# Patient Record
Sex: Male | Born: 1965 | Race: White | Hispanic: No | Marital: Married | Smoking: Current every day smoker
Health system: Southern US, Academic
[De-identification: ages and names within clinical notes are randomized; demographics above are authoritative.]

## PROBLEM LIST (undated history)

## (undated) DIAGNOSIS — J449 Chronic obstructive pulmonary disease, unspecified: Secondary | ICD-10-CM

## (undated) DIAGNOSIS — I10 Essential (primary) hypertension: Secondary | ICD-10-CM

## (undated) DIAGNOSIS — E782 Mixed hyperlipidemia: Secondary | ICD-10-CM

## (undated) DIAGNOSIS — E119 Type 2 diabetes mellitus without complications: Secondary | ICD-10-CM

## (undated) HISTORY — PX: HAND SURGERY: SHX662

---

## 2000-02-16 ENCOUNTER — Other Ambulatory Visit (HOSPITAL_COMMUNITY): Payer: Self-pay

## 2022-01-21 IMAGING — MR MRI LUMBAR SPINE WITHOUT CONTRAST
6 series · 48 of 48 positions shown · IV contrast (gadolinium)
Comparison: None available.

﻿EXAM:  [id number]   MRI LUMBAR SPINE WITHOUT CONTRAST
INDICATION: Lower back pain with right lower extremity radiculopathy.
TECHNIQUE: Multiplanar multisequential MRI of the lumbar spine was performed without gadolinium contrast.

[Series 7: T2 · coronal · 4.0mm · 1.79mm/px · 9 of 20 slices shown (1 of 3)]
[im 1/20]
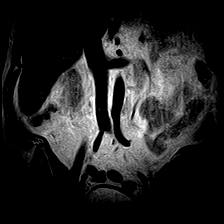
[im 3/20]
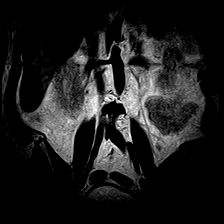
[im 5/20]
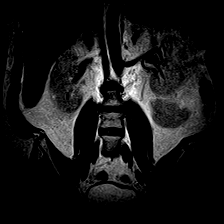
[im 8/20]
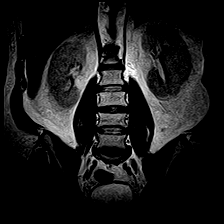
[im 10/20]
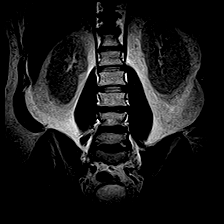
[im 12/20]
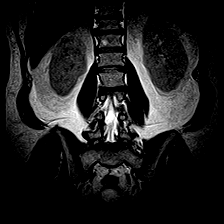
[im 15/20]
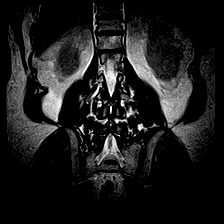
[im 17/20]
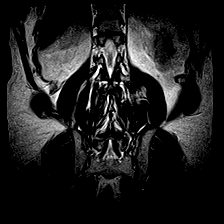
[im 20/20]
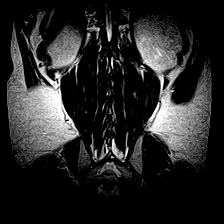

[Series 8: T2 · sagittal · 5.0mm · 1.00mm/px · 7 of 15 slices shown (2 of 3)]
[im 1/15]
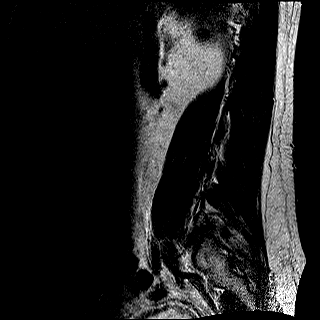
[im 3/15]
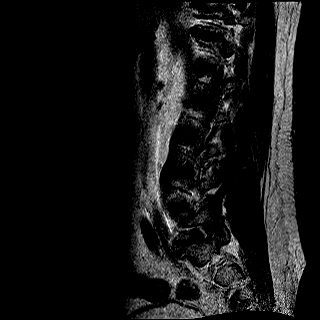
[im 5/15]
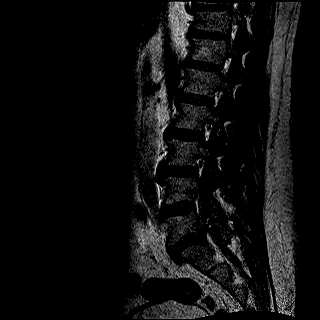
[im 8/15]
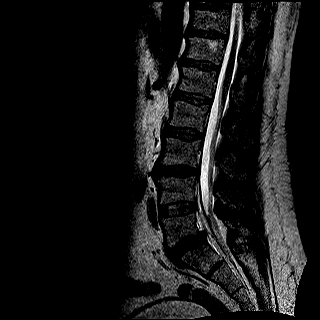
[im 10/15]
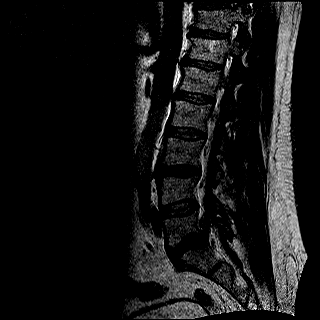
[im 12/15]
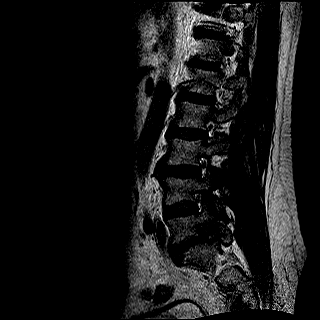
[im 15/15]
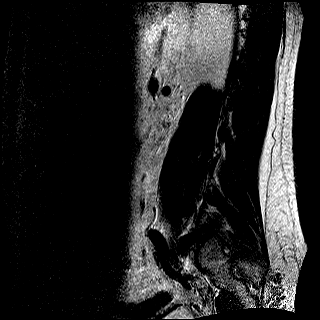

[Series 9: T1 · sagittal · 5.0mm · 1.00mm/px · 6 of 15 slices shown (1 of 2)]
[im 1/15]
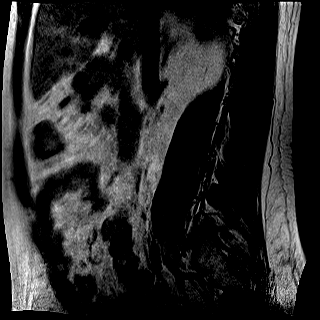
[im 3/15]
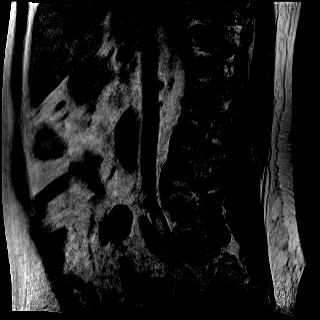
[im 6/15]
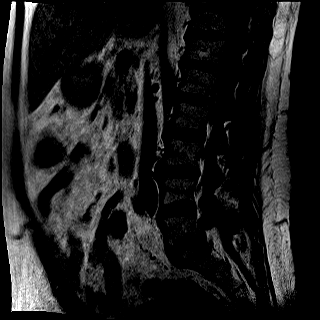
[im 9/15]
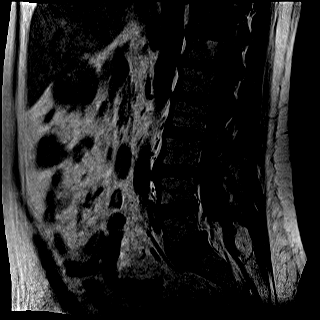
[im 12/15]
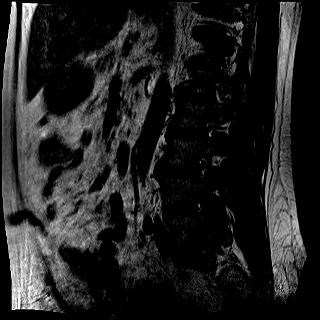
[im 15/15]
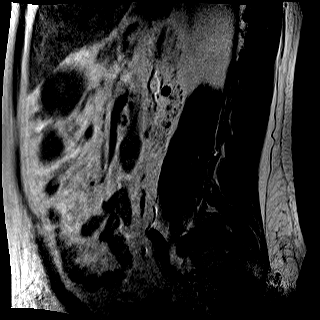

[Series 10: STIR · sagittal · 5.0mm · 1.25mm/px · 6 of 15 slices shown]
[im 1/15]
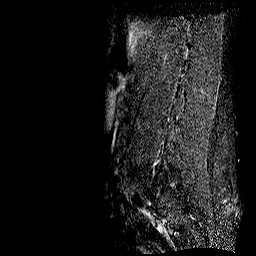
[im 3/15]
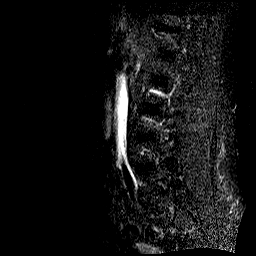
[im 6/15]
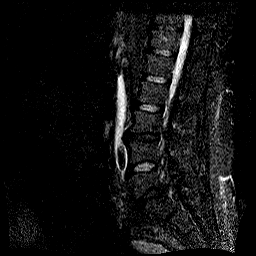
[im 9/15]
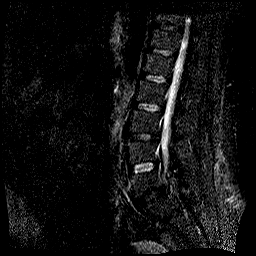
[im 12/15]
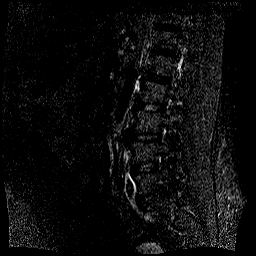
[im 15/15]
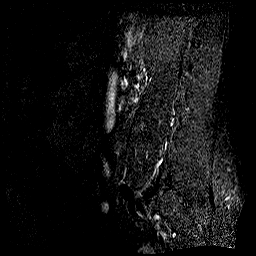

[Series 11: T2 · axial · 5.0mm · 0.89mm/px · z∈[-135,+69]mm · 10 of 25 slices shown (3 of 3)]
[im 1/25]
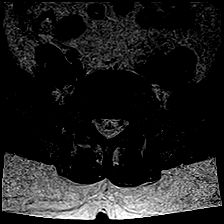
[im 3/25]
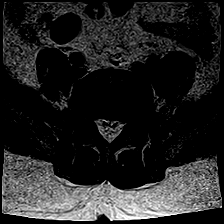
[im 6/25]
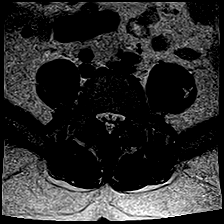
[im 9/25]
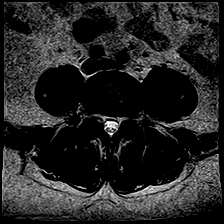
[im 11/25]
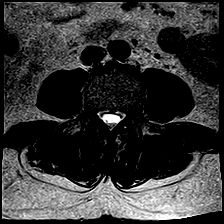
[im 14/25]
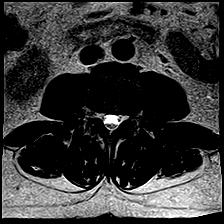
[im 17/25]
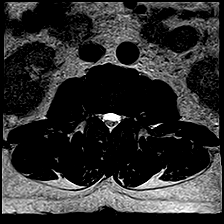
[im 19/25]
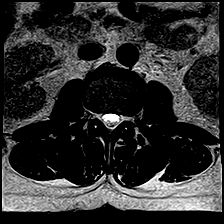
[im 22/25]
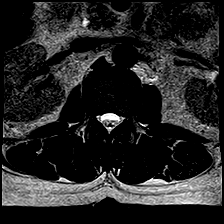
[im 25/25]
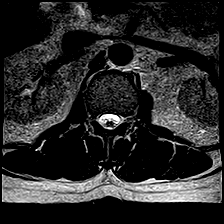

[Series 12: T1 · axial · 5.0mm · 0.89mm/px · z∈[-135,+69]mm · 10 of 25 slices shown (2 of 2)]
[im 1/25]
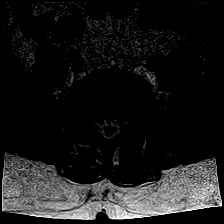
[im 3/25]
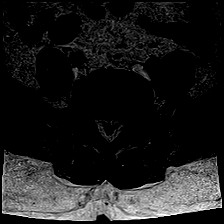
[im 6/25]
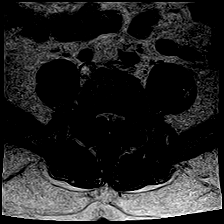
[im 9/25]
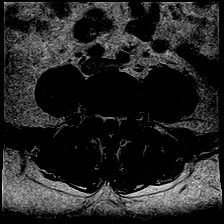
[im 11/25]
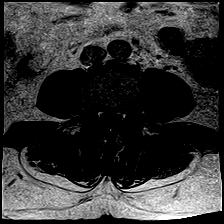
[im 14/25]
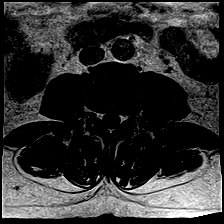
[im 17/25]
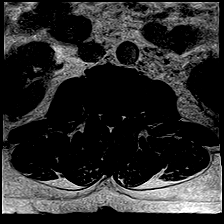
[im 19/25]
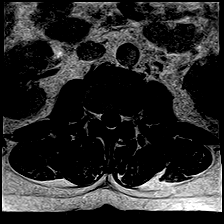
[im 22/25]
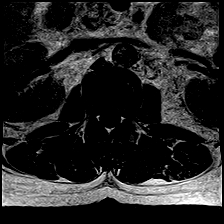
[im 25/25]
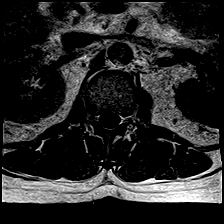

[48 of 48 positions shown; findings below may reference images not displayed]

FINDINGS: Vertebral bodies are normal in height, alignment and signal intensity. There is no acute fracture or subluxation. Distal spinal cord is normal in signal intensity and terminates normally at L1-2 disc space level. Spinal canal is congenitally narrow. 

L1-2 and L2-3 levels are unremarkable.

At L3-4 level, there is a small broad-based central disc bulge mildly effacing the ventral thecal sac.  There is mild-to-moderate bilateral neural foraminal stenosis from bulging annulus. 

At L4-5 level, there is moderate bilateral neural foraminal stenosis from facet arthropathy. 

At L5-S1 level, there is a small broad-based central disc bulge without mass effect on the thecal sac.  There is moderate to severe spinal stenosis from epidural lipomatosis. There is severe bilateral neural foraminal stenosis from facet arthropathy and bulging annulus. 

Paraspinal soft tissues are unremarkable
IMPRESSION: 1. No significant disc herniation at any level.  

2. Moderate to severe spinal stenosis at L5-S1 level from epidural lipomatosis. 

3. Multilevel neural foraminal stenosis as detailed above.

## 2022-01-27 ENCOUNTER — Other Ambulatory Visit: Payer: Self-pay

## 2022-01-27 ENCOUNTER — Emergency Department
Admission: EM | Admit: 2022-01-27 | Discharge: 2022-01-27 | Disposition: A | Discharge status: Home / Self Care | Payer: HMO | Attending: Family | Admitting: Family

## 2022-01-27 ENCOUNTER — Encounter (HOSPITAL_COMMUNITY): Payer: Self-pay

## 2022-01-27 ENCOUNTER — Emergency Department (HOSPITAL_COMMUNITY): Payer: HMO

## 2022-01-27 DIAGNOSIS — J449 Chronic obstructive pulmonary disease, unspecified: Secondary | ICD-10-CM | POA: Insufficient documentation

## 2022-01-27 DIAGNOSIS — E119 Type 2 diabetes mellitus without complications: Secondary | ICD-10-CM | POA: Insufficient documentation

## 2022-01-27 DIAGNOSIS — I10 Essential (primary) hypertension: Secondary | ICD-10-CM

## 2022-01-27 DIAGNOSIS — I440 Atrioventricular block, first degree: Secondary | ICD-10-CM | POA: Insufficient documentation

## 2022-01-27 DIAGNOSIS — R079 Chest pain, unspecified: Secondary | ICD-10-CM | POA: Insufficient documentation

## 2022-01-27 HISTORY — DX: Mixed hyperlipidemia: E78.2

## 2022-01-27 HISTORY — DX: Type 2 diabetes mellitus without complications (CMS HCC): E11.9

## 2022-01-27 HISTORY — DX: Chronic obstructive pulmonary disease, unspecified (CMS HCC): J44.9

## 2022-01-27 HISTORY — DX: Essential (primary) hypertension: I10

## 2022-01-27 LAB — PT/INR
INR: 1.01 (ref <=5.00)
PROTHROMBIN TIME: 11.7 seconds (ref 9.8–12.7)

## 2022-01-27 LAB — COMPREHENSIVE METABOLIC PANEL, NON-FASTING
ALBUMIN/GLOBULIN RATIO: 2.1 — ABNORMAL HIGH (ref 0.8–1.4)
ALBUMIN: 4.5 g/dL (ref 3.5–5.7)
ALKALINE PHOSPHATASE: 64 U/L (ref 34–104)
ALT (SGPT): 21 U/L (ref 7–52)
ANION GAP: 9 mmol/L — ABNORMAL LOW (ref 10–20)
AST (SGOT): 14 U/L (ref 13–39)
BILIRUBIN TOTAL: 0.3 mg/dL (ref 0.3–1.2)
BUN/CREA RATIO: 24 — ABNORMAL HIGH (ref 6–22)
BUN: 17 mg/dL (ref 7–25)
CALCIUM, CORRECTED: 9 mg/dL (ref 8.9–10.8)
CALCIUM: 9.5 mg/dL (ref 8.6–10.3)
CHLORIDE: 105 mmol/L (ref 98–107)
CO2 TOTAL: 23 mmol/L (ref 21–31)
CREATININE: 0.71 mg/dL (ref 0.60–1.30)
ESTIMATED GFR: 108 mL/min/{1.73_m2} (ref >59)
GLOBULIN: 2.1 — ABNORMAL LOW (ref 2.9–5.4)
GLUCOSE: 216 mg/dL — ABNORMAL HIGH (ref 74–109)
OSMOLALITY, CALCULATED: 282 mOsm/kg (ref 270–290)
POTASSIUM: 4.1 mmol/L (ref 3.5–5.1)
PROTEIN TOTAL: 6.6 g/dL (ref 6.4–8.9)
SODIUM: 137 mmol/L (ref 136–145)

## 2022-01-27 LAB — CBC WITH DIFF
BASOPHIL #: 0.1 10*3/uL (ref 0.00–0.30)
BASOPHIL %: 1 % (ref 0–3)
EOSINOPHIL #: 0.1 10*3/uL (ref 0.00–0.80)
EOSINOPHIL %: 1 % (ref 0–7)
HCT: 47.2 % (ref 42.0–51.0)
HGB: 16.3 g/dL (ref 13.5–18.0)
LYMPHOCYTE #: 2.6 10*3/uL (ref 1.10–5.00)
LYMPHOCYTE %: 24 % — ABNORMAL LOW (ref 25–45)
MCH: 32.2 pg — ABNORMAL HIGH (ref 27.0–32.0)
MCHC: 34.6 g/dL (ref 32.0–36.0)
MCV: 93.1 fL (ref 78.0–99.0)
MONOCYTE #: 1 10*3/uL (ref 0.00–1.30)
MONOCYTE %: 9 % (ref 0–12)
MPV: 7.2 fL — ABNORMAL LOW (ref 7.4–10.4)
NEUTROPHIL #: 7.1 10*3/uL (ref 1.80–8.40)
NEUTROPHIL %: 66 % (ref 40–76)
PLATELETS: 361 10*3/uL (ref 140–440)
RBC: 5.07 10*6/uL (ref 4.20–6.00)
RDW: 13.5 % (ref 11.6–14.8)
WBC: 10.8 10*3/uL — ABNORMAL HIGH (ref 4.0–10.5)
WBCS UNCORRECTED: 10.8 10*3/uL

## 2022-01-27 LAB — TROPONIN-I
TROPONIN I: 7 ng/L (ref <20)
TROPONIN I: 8 ng/L (ref <20)

## 2022-01-27 LAB — MAGNESIUM: MAGNESIUM: 1.9 mg/dL (ref 1.9–2.7)

## 2022-01-27 LAB — B-TYPE NATRIURETIC PEPTIDE: BNP: <25 pg/mL (ref 5–100)

## 2022-01-27 LAB — PTT (PARTIAL THROMBOPLASTIN TIME): APTT: 31.2 seconds (ref 26.0–36.0)

## 2022-01-27 NOTE — ED Nurses Note (Signed)
Patient discharged to home. A written copy of discharge instructions was given to the patient. Questions sufficiently answered as needed. Patient encouraged to follow up with PCP as indicated. IV removed, catheter intact. Pressure dressing applied and bleeding controlled. Patient left department via ambulation.

## 2022-01-27 NOTE — ED Nurses Note (Addendum)
Patient to EDM via EMS with complaints of chest pain that started around 1330 today. (around left breast). Patient denies any SOB. Patient states current pain level is 7/10. Patient placed on bedside monitoring. Assessment as documented. Call bell in reach.

## 2022-01-27 NOTE — ED Provider Notes (Signed)
[facility]  ED Primary Provider Note  History of Present Illness   Chief Complaint   Patient presents with   . Chest Pain      Shane Hartman is a 56 y.o. male who had concerns including Chest Pain .  Arrival: The patient arrived by Ambulance    Patient 56 year old male presents emergency department complaining of substernal chest pain that started approximately 1 hour prior to arrival.  Patient states it is directly in the middle of his chest and denies radiation.  Patient describes as a sharp pain that is constant and he rates it a 5/10. 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 Patient was brought in by EMS and given nitroglycerin x2, 324 of aspirin, and 4 mg of morphine with no relief of pain.  Patient denies shortness of breath, nausea, vomiting, abdominal pain.  Patient states he does have a history of hypertension, diabetes, COPD, and a family history of MIs.  Patient personally denies any heart attacks or stents in the past.          Review of Systems   Pertinent positive and negative ROS as per HPI.  Historical Data   History Reviewed This Encounter:      Physical Exam   ED Triage Vitals [01/27/22 1750]   BP (Non-Invasive) 123/81   Heart Rate 95   Respiratory Rate 16   Temperature 36.3 C (97.4 F)   SpO2 93 %   Weight 127 kg (280 lb)   Height 1.778 m ('5\' 10"' )     Physical Exam  Vitals and nursing note reviewed.   Constitutional:       General: He is not in acute distress.     Appearance: He is well-developed.   HENT:      Head: Normocephalic and atraumatic.   Eyes:      Conjunctiva/sclera: Conjunctivae normal.   Cardiovascular:      Rate and Rhythm: Normal rate and regular rhythm.      Heart sounds: S1 normal and S2 normal. No murmur heard.  Pulmonary:      Effort: Pulmonary effort is normal. No respiratory distress.      Breath sounds: Normal breath sounds.   Abdominal:      Palpations: Abdomen is soft.      Tenderness:  There is no abdominal tenderness.   Musculoskeletal:         General: No swelling.      Cervical back: Neck supple.   Skin:     General: Skin is warm and dry.      Capillary Refill: Capillary refill takes less than 2 seconds.   Neurological:      Mental Status: He is alert.   Psychiatric:         Mood and Affect: Mood normal.       Patient Data     Labs Ordered/Reviewed   COMPREHENSIVE METABOLIC PANEL, NON-FASTING - Abnormal; Notable for the following components:       Result Value    ANION GAP 9 (*)     BUN/CREA RATIO 24 (*)     GLUCOSE 216 (*)     ALBUMIN/GLOBULIN RATIO 2.1 (*)     GLOBULIN 2.1 (*)     All other components within normal limits    Narrative:     Estimated Glomerular Filtration Rate (eGFR) is calculated using the CKD-EPI (2021) equation, intended for patients 74 years of age  and older. If gender is not documented or "unknown", there will be no eGFR calculation.   CBC WITH DIFF - Abnormal; Notable for the following components:    WBC 10.8 (*)     MCH 32.2 (*)     MPV 7.2 (*)     LYMPHOCYTE % 24 (*)     All other components within normal limits   TROPONIN-I - Normal   TROPONIN-I - Normal   MAGNESIUM - Normal   PT/INR - Normal    Narrative:     INR OF 2.0-3.0  RECOMMENDED FOR: PROPHYLAXIS/TREATMENT OF VENEOUS THROMBOSIS, PULMONARY EMBOLISM, PREVENTION OF SYSTEMIC EMBOLISM FROM ATRIAL FIBRILATION, MYOCARDIAL INFARCTION.    INR OF 2.5-3.5  RECOMMENDED FOR MECHANICAL PROSTHETIC HEART VALVES, RECURRENT SYSTEMIC EMBOLISM, RECURRENT MYOCARDIAL INFARCTION.     PTT (PARTIAL THROMBOPLASTIN TIME) - Normal   B-TYPE NATRIURETIC PEPTIDE - Normal    Narrative:                                 Class 1: 101-250 pg/mL                              Class 2: 251-550 pg/mL                              Class 3: 551-900 pg/mL                              Class 4: >901 pg/mL     The [employer] has developed a four-stage functional classification system for CHF that is based on a subjective interpretation of the  severity of a patient's clinical signs and symptoms.    Class 1 - Patients have no limitations on physical activity and have no symptoms with ordinary physical activity.    Class 2 - Patients have a slight limitation of physical activity and have symptoms with ordinary physical activity.    Class 3 - Patients have a marked limitation of physical activity and have symptoms with less than ordinary physical activity, but not at rest.    Class 4 - Patients are unable to perform any physical activity without discomfort.   CBC/DIFF    Narrative:     The following orders were created for panel order CBC/DIFF.  Procedure                               Abnormality         Status                     ---------                               -----------         ------                     CBC WITH ZOXW[[id number]]                Abnormal            Final result  Please view results for these tests on the individual orders.   TROPONIN-I     XR CHEST PA AND LATERAL   Final Result by Edi, Radresults In (05/18 1916)   NEGATIVE CHEST         Radiologist location ID: [place] [facility]        Medical Decision Making  Patient 56 year old male presents emergency department complaining of substernal chest pain that started approximately 1 hour prior to arrival.  Patient states it is directly in the middle of his chest and denies radiation.  Patient describes as a sharp pain that is constant and he rates it a 5/10. 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  Chest x-ray, EKG and cardiac enzymes ordered and evaluated.  EKG shows normal sinus rhythm chest x-ray within normal limits without acute abnormalities noted and troponins initial and 1st hour negative.  Patient no longer complaining of chest pain at this time.  Discussed with patient and patient feels comfortable going home and returning to the emergency department if chest pain returns or  shortness of breath.  Patient otherwise to follow up with primary care provider for further evaluation.    Amount and/or Complexity of Data Reviewed  Labs: ordered.  Radiology: ordered.  ECG/medicine tests: ordered.                  Clinical Impression   Chest pain, unspecified type (Primary)       Disposition: Discharged

## 2022-01-27 NOTE — ED Triage Notes (Signed)
Chest pain x 1 hour around left breast. 324 aspirin, 2 nitro, 4mg  morphine, 20gL hand.

## 2022-01-28 LAB — ECG 12 LEAD
Atrial Rate: 86 {beats}/min
Calculated P Axis: 46 degrees
Calculated R Axis: 60 degrees
Calculated T Axis: 48 degrees
PR Interval: 224 ms
QRS Duration: 94 ms
QT Interval: 356 ms
QTC Calculation: 426 ms
Ventricular rate: 86 {beats}/min

## 2022-04-12 ENCOUNTER — Other Ambulatory Visit: Payer: Self-pay

## 2022-04-12 ENCOUNTER — Ambulatory Visit (INDEPENDENT_AMBULATORY_CARE_PROVIDER_SITE_OTHER): Payer: HMO | Admitting: PHYSICIAN ASSISTANT

## 2022-04-12 ENCOUNTER — Encounter (INDEPENDENT_AMBULATORY_CARE_PROVIDER_SITE_OTHER): Payer: Self-pay | Admitting: PHYSICIAN ASSISTANT

## 2022-04-12 VITALS — BP 149/87 | HR 95 | Ht 70.0 in | Wt 280.1 lb

## 2022-04-12 DIAGNOSIS — M545 Low back pain, unspecified: Secondary | ICD-10-CM

## 2022-04-12 DIAGNOSIS — M4727 Other spondylosis with radiculopathy, lumbosacral region: Secondary | ICD-10-CM

## 2022-04-12 NOTE — H&P (Signed)
[facility]  [address]  [place] [address]  [facility]  History & Physical    Name: Shane Hartman MRN:  [id number]   Date: 04/12/2022 Age: 56 y.o.       Subjective:     Patient ID:   Shane Hartman is an 56 y.o. male.      Chief Complaint:      Chief Complaint   Patient presents with    Establish Care    Low Back Pain     Pain in lower back, radiating into right him, down left leg into left foot. Left foot has been numb for 4 weeks.        HPI    The patient is a pleasant 56 year old male who presents to the clinic today for evaluation of low back pain.  The patient reports his pain began about 6 months ago.  The pain radiates into the posterolateral right hip.  He also has numbness and tingling in the left foot, stating it feels like he is walking on a soda can.  Rates his pain 7/10 and describes it as constant in nature.  He has no weakness involving the lower extremities.  He reports some urinary hesitancy but denies incontinence.  He is not had physical therapy or injections at this point.    Past Medical History:   Diagnosis Date    COPD (chronic obstructive pulmonary disease) (CMS HCC)     Diabetes mellitus, type 2 (CMS HCC)     HTN (hypertension)     Mixed hyperlipidemia       Past Surgical History:   Procedure Laterality Date    HAND SURGERY Right       Social History     Tobacco Use    Smoking status: Every Day     Packs/day: 1.00     Years: 30.00     Pack years: 30.00     Types: Cigarettes    Smokeless tobacco: Never   Substance Use Topics    Alcohol use: Never    Drug use: Never      Family Medical History:    None        Current Outpatient Medications   Medication Sig    albuterol sulfate (PROVENTIL OR VENTOLIN OR PROAIR) 90 mcg/actuation Inhalation oral inhaler Take 1-2 Puffs by inhalation Every 6 hours as needed    amLODIPine (NORVASC) 5 mg Oral Tablet Take 1 Tablet (5 mg total) by mouth Once a day    ascorbic acid (VITAMIN  C) 1,000 mg Oral Tablet Take 1 Tablet (1,000 mg total) by mouth Once a day    aspirin 81 mg Oral Tablet, Chewable Chew 1 Tablet (81 mg total) Once a day    dapagliflozin propanediol (FARXIGA) 10 mg Oral Tablet Take 1 Tablet (10 mg total) by mouth Once a day    dulaglutide (TRULICITY) 1.5 mg/0.5 mL Subcutaneous Pen Injector Inject 0.5 mL (1.5 mg total) under the skin Every 7 days    enalapril (VASOTEC) 20 mg Oral Tablet Take 1 Tablet (20 mg total) by mouth Once a day    esomeprazole magnesium (NEXIUM) 40 mg Oral Capsule, Delayed Release(E.C.) Take 1 Capsule (40 mg total) by mouth Once a day    fenofibrate nanocrystallized (TRICOR) 145 mg Oral Tablet Take 1 Tablet (145 mg total) by mouth Every morning with breakfast    glimepiride (AMARYL) 4 mg Oral Tablet Take 1 Tablet (4 mg total) by  mouth Every morning with breakfast    MetFORMIN (GLUCOPHAGE) 1,000 mg Oral Tablet Take 1 Tablet (1,000 mg total) by mouth Twice daily with food    ONETOUCH ULTRA TEST Strip USE 1 STRIP TO CHECK GLUCOSE TWICE DAILY    pregabalin (LYRICA) 100 mg Oral Capsule Take 1 Capsule (100 mg total) by mouth Three times a day    rosuvastatin (CRESTOR) 20 mg Oral Tablet Take 1 Tablet (20 mg total) by mouth Every evening    umeclidinium-vilanteroL (ANORO ELLIPTA) 62.5-25 mcg/actuation Inhalation oral diskus inhaler Take by inhalation Once a day    zinc Sulfate (ZINCATE) 50 mg zinc (220 mg) Oral Tablet Take 1 Tablet (50 mg total) by mouth Once a day       No Known Allergies     Review of Systems:  Review of systems pertinent negatives and postives as discussed in HPI.     Objective:   Neurological Exam  Mental Status  Awake, alert and oriented to person, place and time. Oriented to person, place and time. Recent and remote memory are intact. Speech is normal. Language is fluent with no aphasia. Attention and concentration are normal.    Cranial Nerves  CN II: Visual acuity is normal. Visual fields full to confrontation.  CN III, IV, VI: Extraocular  movements intact bilaterally. Normal lids and orbits bilaterally. Pupils equal round and reactive to light bilaterally.  CN V: Facial sensation is normal.  CN VII: Full and symmetric facial movement.  CN VIII: Hearing is normal.  CN IX, X: Palate elevates symmetrically. Normal gag reflex.  CN XI: Shoulder shrug strength is normal.  CN XII: Tongue midline without atrophy or fasciculations.    Motor  Normal muscle bulk throughout. No fasciculations present. Normal muscle tone. Strength is 5/5 throughout all four extremities.    Sensory  Sensation is intact to light touch, pinprick, vibration and proprioception in all four extremities.    Reflexes  Deep tendon reflexes are 2+ and symmetric in all four extremities.    Coordination    Finger-to-nose, rapid alternating movements and heel-to-shin normal bilaterally without dysmetria.    Gait  Normal casual, toe, heel and tandem gait.    Data reviewed:  Imaging:  Images are not available as the patient is imaging center was unable to push images at this time.  Patient did not bring a CD with images on the disc.  Radiographic interpretation by radiology is available.  This reveals MRI of the lumbosacral spine performed at [facility] on Jan 21, 2022.  This shows multilevel spondylosis.  At L3-4, there is mild-to-moderate bilateral neuroforaminal stenosis secondary to a broad-based disc bulge.  At L4-5, there is moderate bilateral foraminal stenosis from facet arthropathy.  At L5-S1, there is a moderate broad-based disc bulge which does not cause significant spinal canal stenosis.  It does contribute to severe bilateral neuroforaminal stenosis with facet arthropathy and a bulging annulus.  There is moderate to severe spinal stenosis from epidural lipomatosis.       Assessment & Plan:       ICD-10-CM    1. Lumbosacral spondylosis with radiculopathy  M47.27         The patient presents with primary complaint of low back pain, right hip pain and left foot  numbness.  The patient has MRI findings of severe bilateral foraminal stenosis at L5-S1 which is likely potentiate in her right L5 radiculopathy and contributing to numbness and tingling in the left foot.  We will provide  the patient an order for physical therapy to see if we can get some improvement of his symptoms.  We will also arrange for bilateral L5 transforaminal epidural steroid injections.  We will see him back after his injection to discuss his results.  I instructed the patient to bring a copy of his images on a CD so that we may review those.  He expressed understanding.    On the day of the encounter, a total of  30  minutes was spent on this patient encounter including review of historical information, examination, documentation and post-visit activities. The time documented excludes procedural time.   Sunny Schlein, PA-C    This note was partially generated using MModal Fluency Direct system, and there may be some incorrect words, spellings, and punctuation that were not noted in checking the note before saving.

## 2022-06-10 ENCOUNTER — Encounter (HOSPITAL_BASED_OUTPATIENT_CLINIC_OR_DEPARTMENT_OTHER): Payer: HMO | Admitting: Neurological Surgery

## 2022-06-10 ENCOUNTER — Ambulatory Visit (HOSPITAL_COMMUNITY): Payer: Self-pay | Admitting: Neurological Surgery

## 2022-06-10 DIAGNOSIS — M5416 Radiculopathy, lumbar region: Secondary | ICD-10-CM

## 2022-08-24 IMAGING — DX XRAY HIP W/PELVIS BIL 3-4 VIEWS
2 series · 3 of 3 positions shown · non-contrast
Comparison: None available.

﻿EXAM:  [id number]   XRAY HIP W/PELVIS BIL 3-4 VIEWS
INDICATION: Bilateral hip pain.
TECHNIQUE: AP pelvis and bilateral hips.

[lateral (1 of 2)]
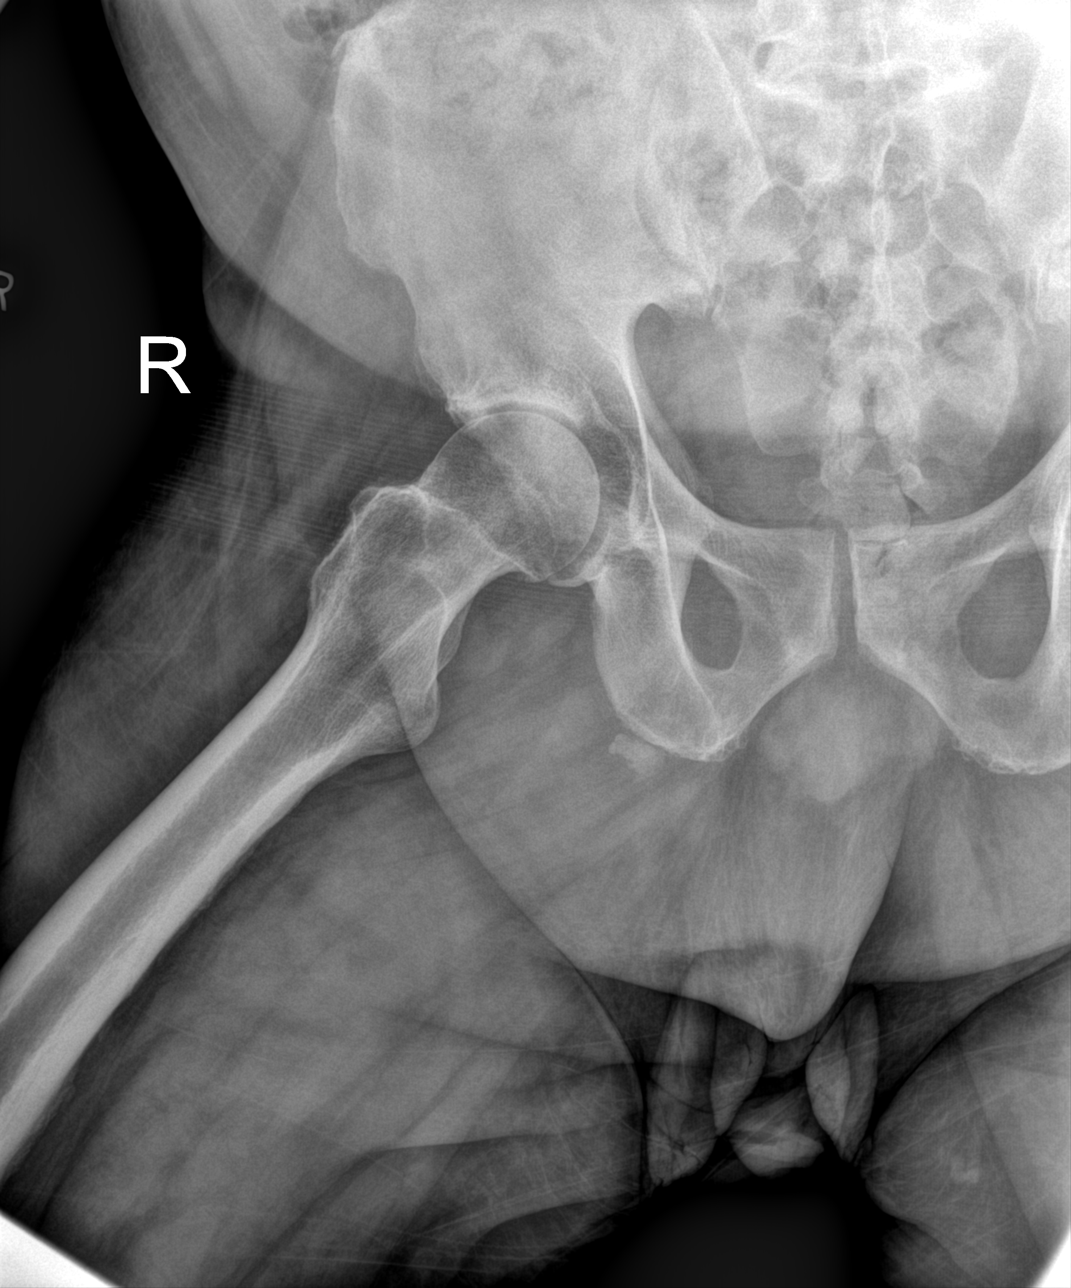

[Series 1: lateral · 0.14mm/px · 2 of 2 slices shown (2 of 2)]
[im 1/2]
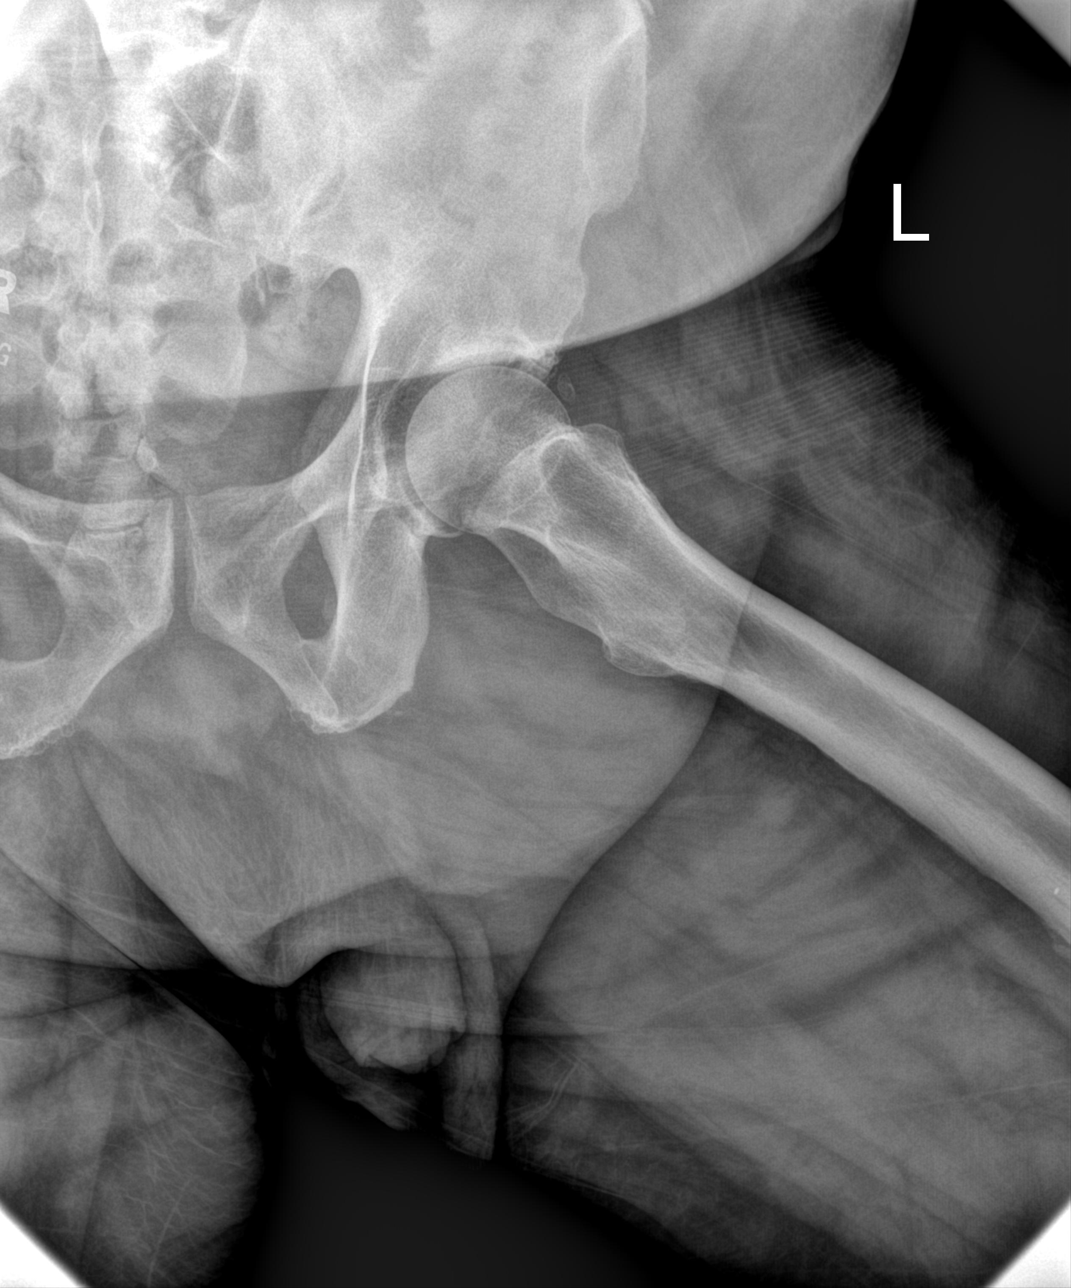
[im 2/2]
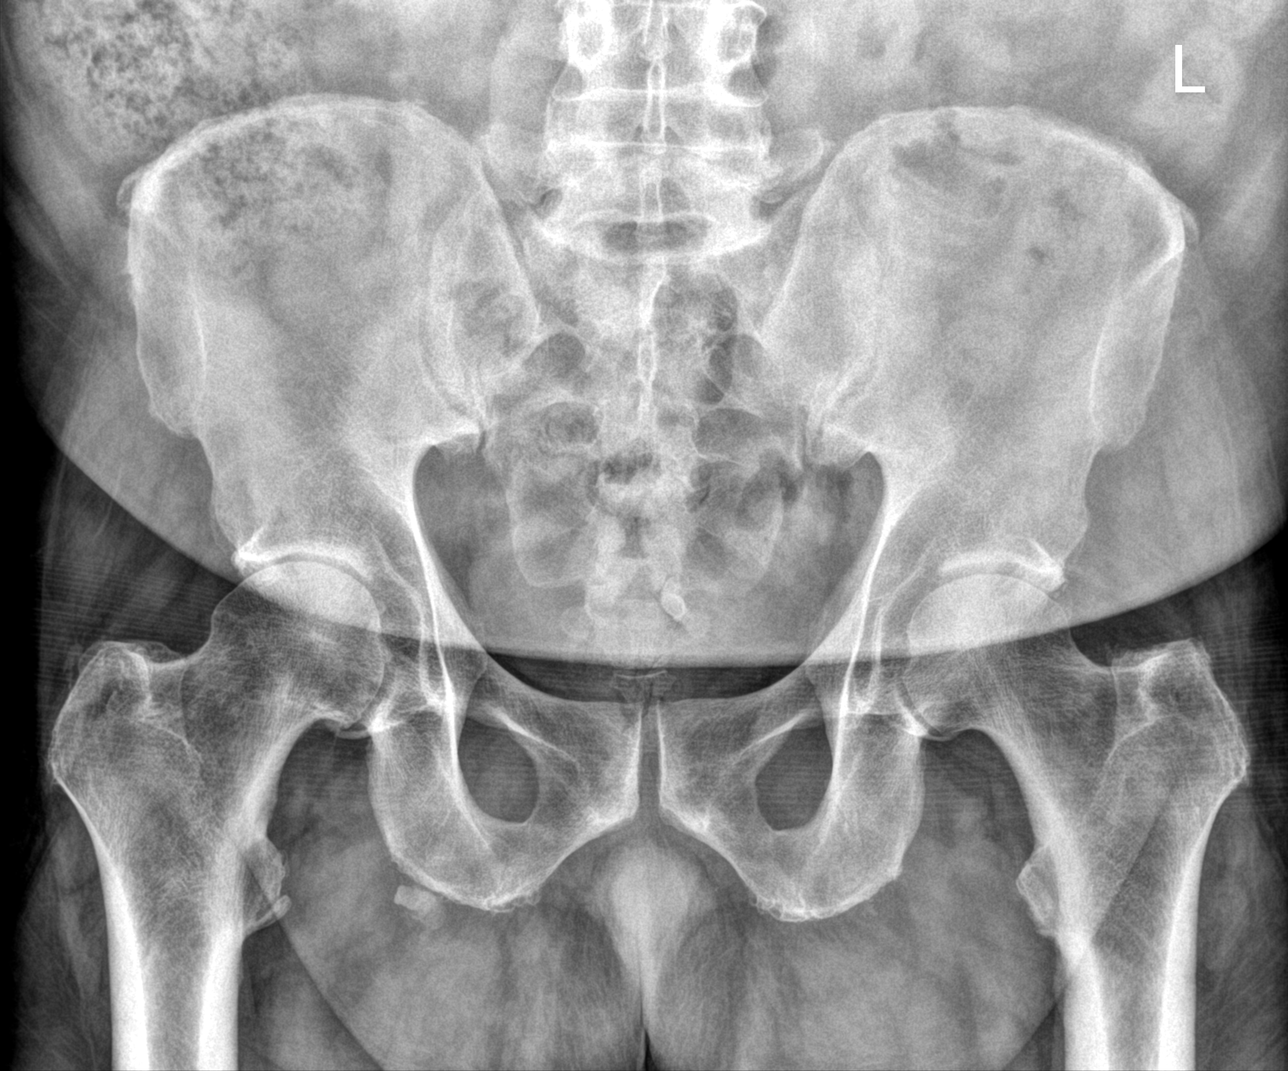

[3 of 3 positions shown; findings below may reference images not displayed]

FINDINGS: No acute bony lesions are seen.  Moderate osteoarthritis of right hip. Mild osteoarthritis of left hip.  Mild arthritis of sacroiliac joints.

Calcific tendinitis of the iliopsoas at the lesser trochanter and hamstring at the right ischium are noted.
IMPRESSION: 1. Moderate right hip osteoarthritis. Mild left hip osteoarthritis. 

2. Calcific tendinitis at the right hip as mentioned above.

3. If symptoms warrant, further evaluation with MRI is indicated.

## 2022-11-15 IMAGING — MR MRI LUMBAR SPINE WITHOUT CONTRAST
6 series · 48 of 48 positions shown · IV contrast (gadolinium)
Comparison: MRI lumbar spine dated 01/21/2022.

﻿EXAM:  [id number]   MRI LUMBAR SPINE WITHOUT CONTRAST
INDICATION: Persistent low back pain. Radicular symptoms to right side.  No history of prior back surgery.
TECHNIQUE: Multiplanar, multisequential MRI of the lumbosacral spine was performed without gadolinium contrast.

[Series 4: T2 · sagittal · 5.0mm · 1.00mm/px · 7 of 15 slices shown (1 of 3)]
[im 1/15]
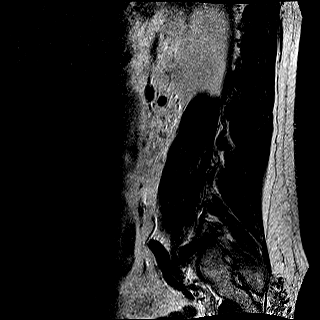
[im 3/15]
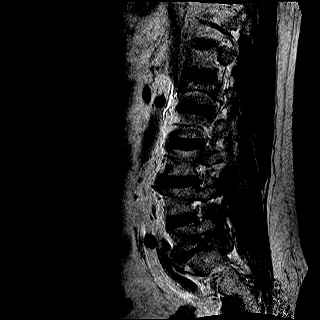
[im 5/15]
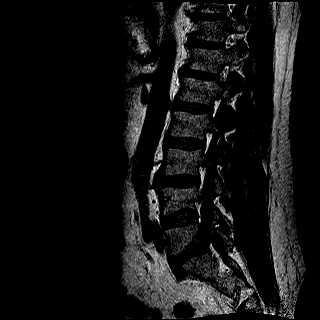
[im 8/15]
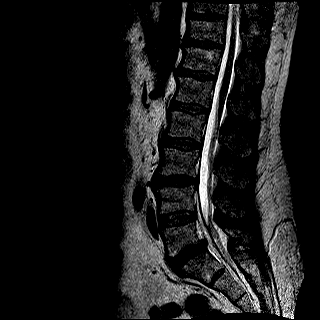
[im 10/15]
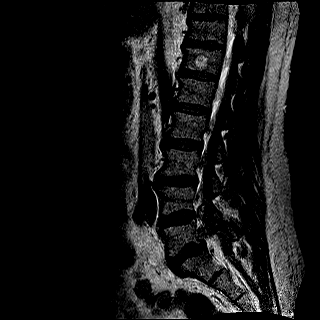
[im 12/15]
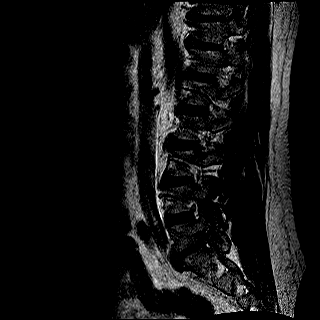
[im 15/15]
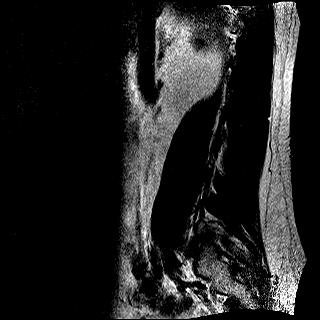

[Series 5: T1 · sagittal · 5.0mm · 1.00mm/px · 7 of 15 slices shown (1 of 2)]
[im 1/15]
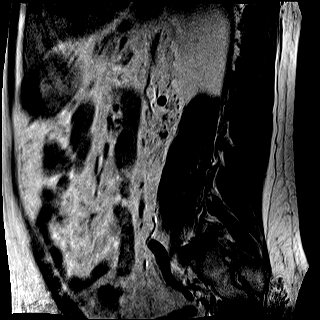
[im 3/15]
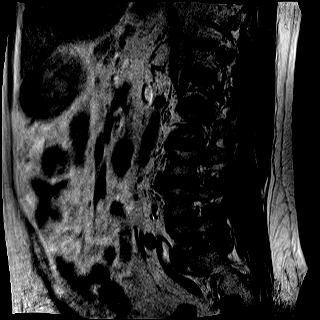
[im 5/15]
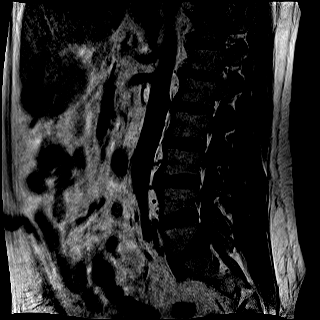
[im 8/15]
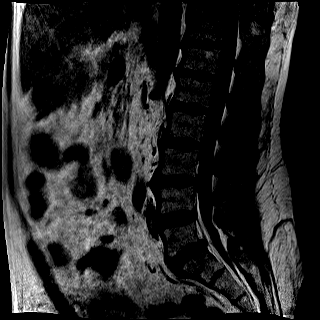
[im 10/15]
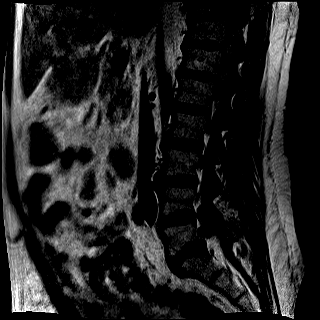
[im 12/15]
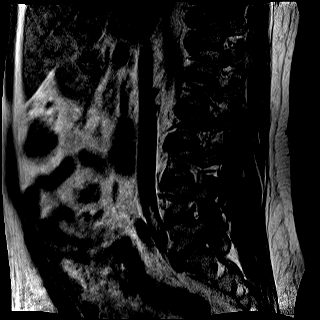
[im 15/15]
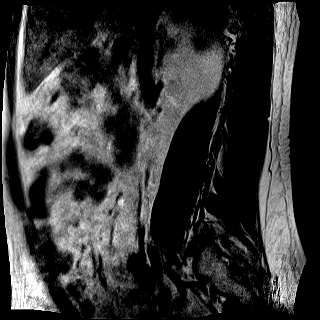

[Series 6: STIR · sagittal · 5.0mm · 1.25mm/px · 6 of 15 slices shown]
[im 1/15]
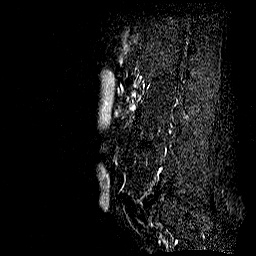
[im 3/15]
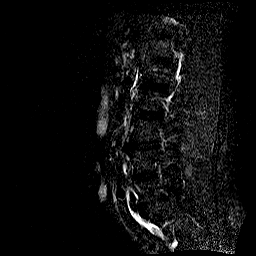
[im 6/15]
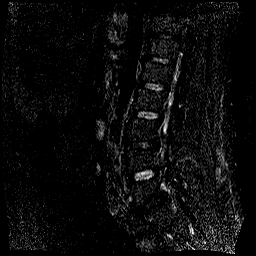
[im 9/15]
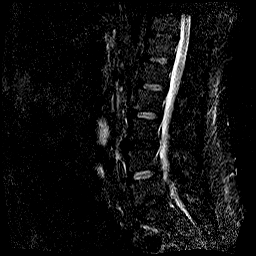
[im 12/15]
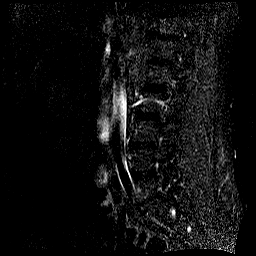
[im 15/15]
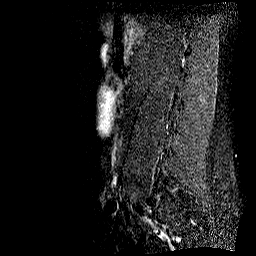

[Series 7: T2 · axial · 5.0mm · 0.89mm/px · z∈[-146,+66]mm · 10 of 25 slices shown (2 of 3)]
[im 1/25]
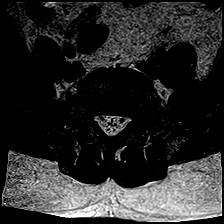
[im 3/25]
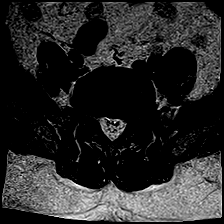
[im 6/25]
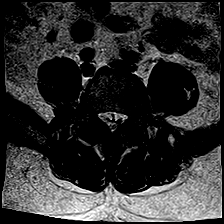
[im 9/25]
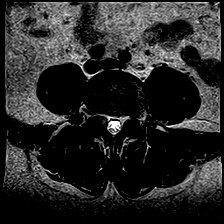
[im 11/25]
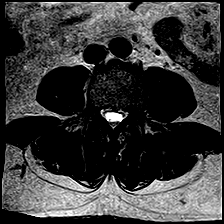
[im 14/25]
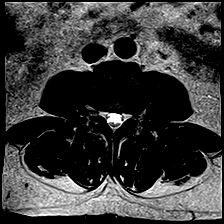
[im 17/25]
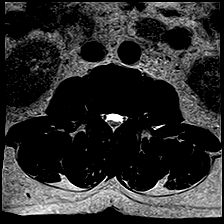
[im 19/25]
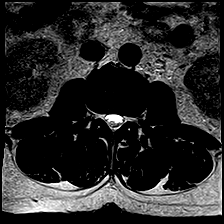
[im 22/25]
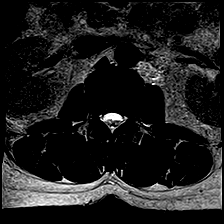
[im 25/25]
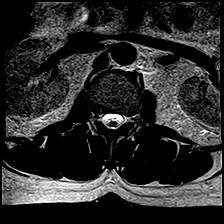

[Series 8: T1 · axial · 5.0mm · 0.89mm/px · z∈[-146,+66]mm · 10 of 25 slices shown (2 of 2)]
[im 1/25]
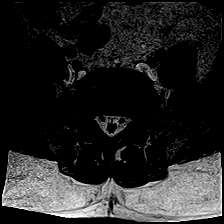
[im 3/25]
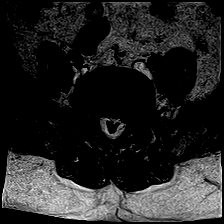
[im 6/25]
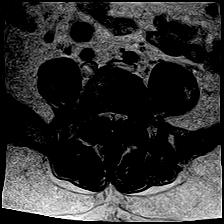
[im 9/25]
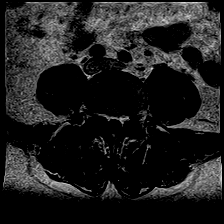
[im 11/25]
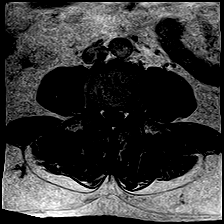
[im 14/25]
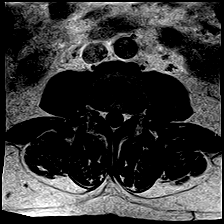
[im 17/25]
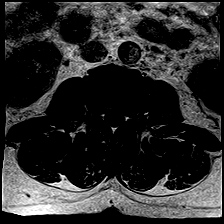
[im 19/25]
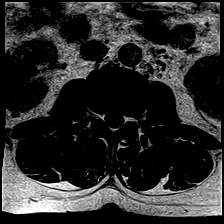
[im 22/25]
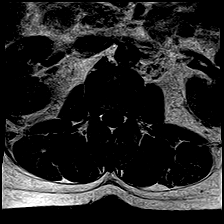
[im 25/25]
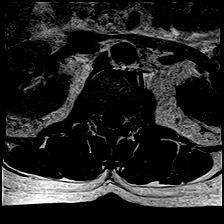

[Series 9: T2 · coronal · 4.0mm · 1.56mm/px · 8 of 20 slices shown (3 of 3)]
[im 1/20]
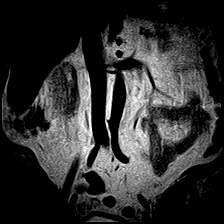
[im 3/20]
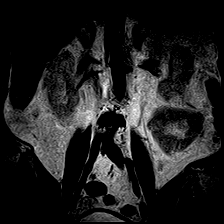
[im 6/20]
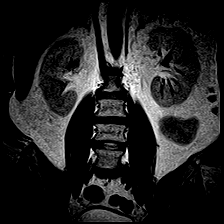
[im 9/20]
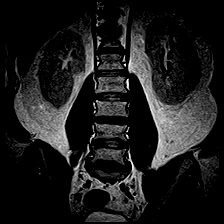
[im 11/20]
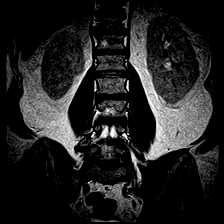
[im 14/20]
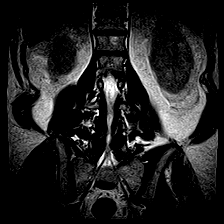
[im 17/20]
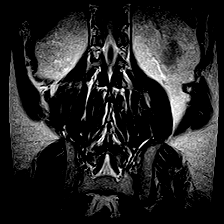
[im 20/20]
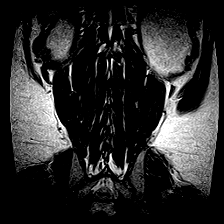

[48 of 48 positions shown; findings below may reference images not displayed]

FINDINGS: No acute bony lesions of lumbar vertebrae are seen. 

Lower spinal cord and cauda equina are normal. 

At L1-2 level, mild degenerative disc changes causing minimal bilateral foraminal narrowing. Similar finding is noted at L2-3 level. 

At L3-4 level, degenerative disc disease of moderate degree with bulging annulus and facet arthropathy are causing mild to moderate compromise of both lateral recesses and neural foramina similar to previous examination.

At L4-5 level, degenerative disc disease is noted along with significant bilateral facet arthropathy. Small bilateral synovial cysts are noted at the facet joint on both sides measuring up to 6 mm in transverse diameter.  There is significant compromise of both lateral recesses and neural foramina at this level due to these changes, slightly more prominent compared with previous examination. 

At L5-S1 level, significant facet arthropathy is noted along with first-degree spondylolisthesis. Degenerative disc disease of moderately significant degree with bulging annulus is noted.  Combination causes severe biforaminal stenosis similar to previous examination.

Paravertebral soft tissues are unremarkable.
IMPRESSION: 1. No acute bone changes of lumbar vertebrae. 

2. At L4-5 level, degenerative disc disease is noted along with significant bilateral facet arthropathy. Small bilateral synovial cysts are noted at the facet joint on both sides measuring up to 6 mm in transverse diameter.  There is significant compromise of both lateral recesses and neural foramina at this level due to these changes, slightly more prominent compared with previous examination. 

3. At L5-S1 level, significant facet arthropathy is noted along with first-degree spondylolisthesis. Degenerative disc disease of moderately significant degree with bulging annulus is noted.  Combination causes severe biforaminal stenosis similar to previous examination.

4. Findings at other disc levels are described above in detail.

## 2023-05-30 IMAGING — DX XRAY THORACIC SPINE 3 VIEWS
1 series · 4 of 4 positions shown · non-contrast
Comparison: None available.

﻿EXAM:  [id number]      XRAY THORACIC SPINE 3 VIEWS
INDICATION: Back pain.

[Series 1: AP · 0.14mm/px · 4 of 4 slices shown]
[im 1/4]
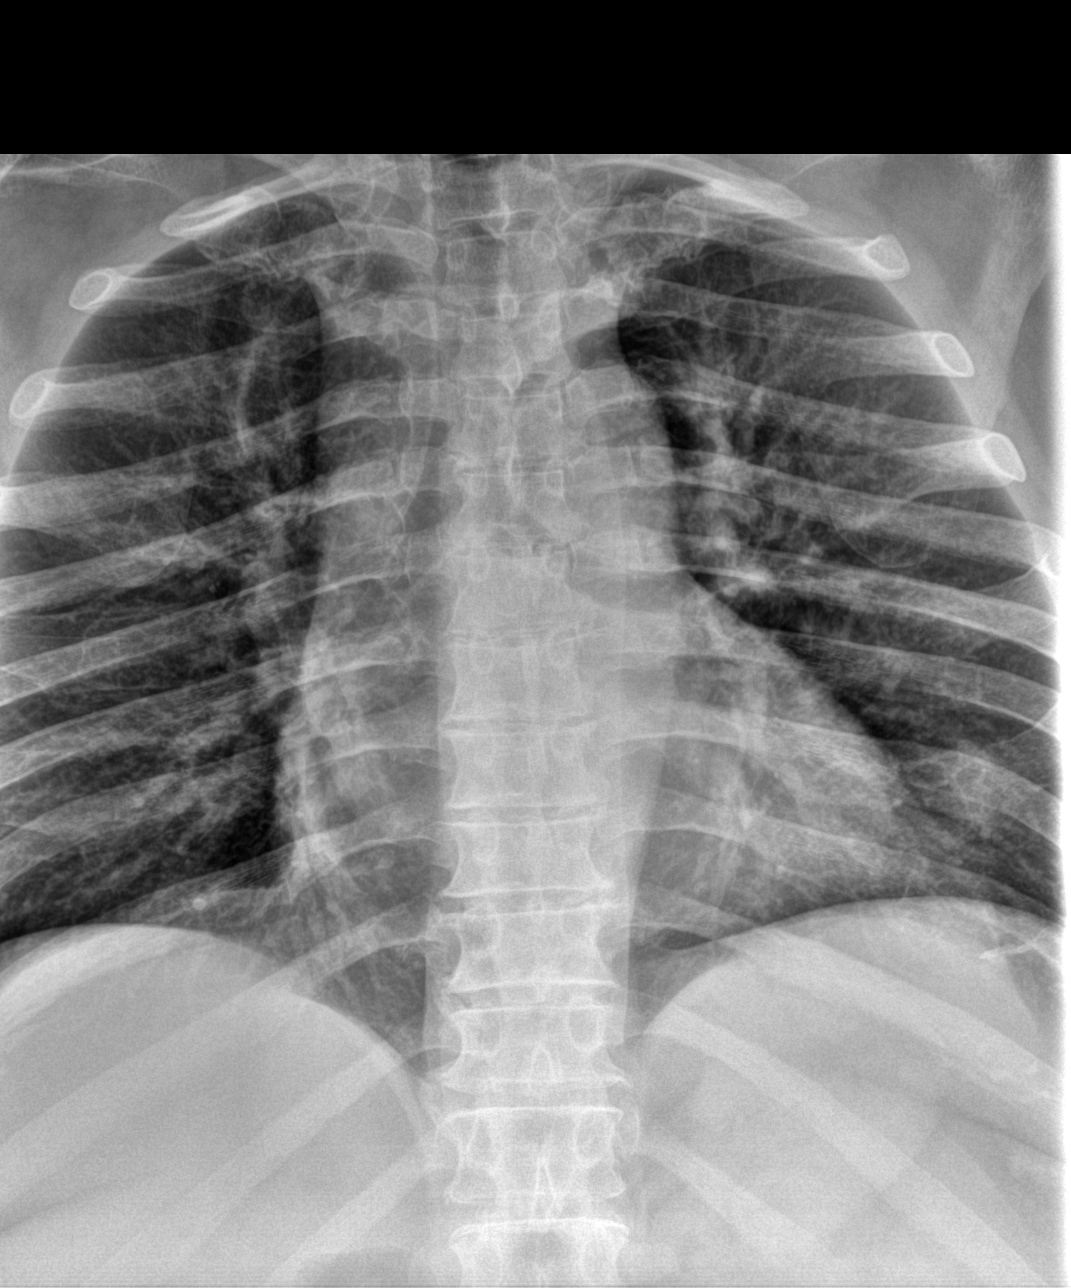
[im 2/4]
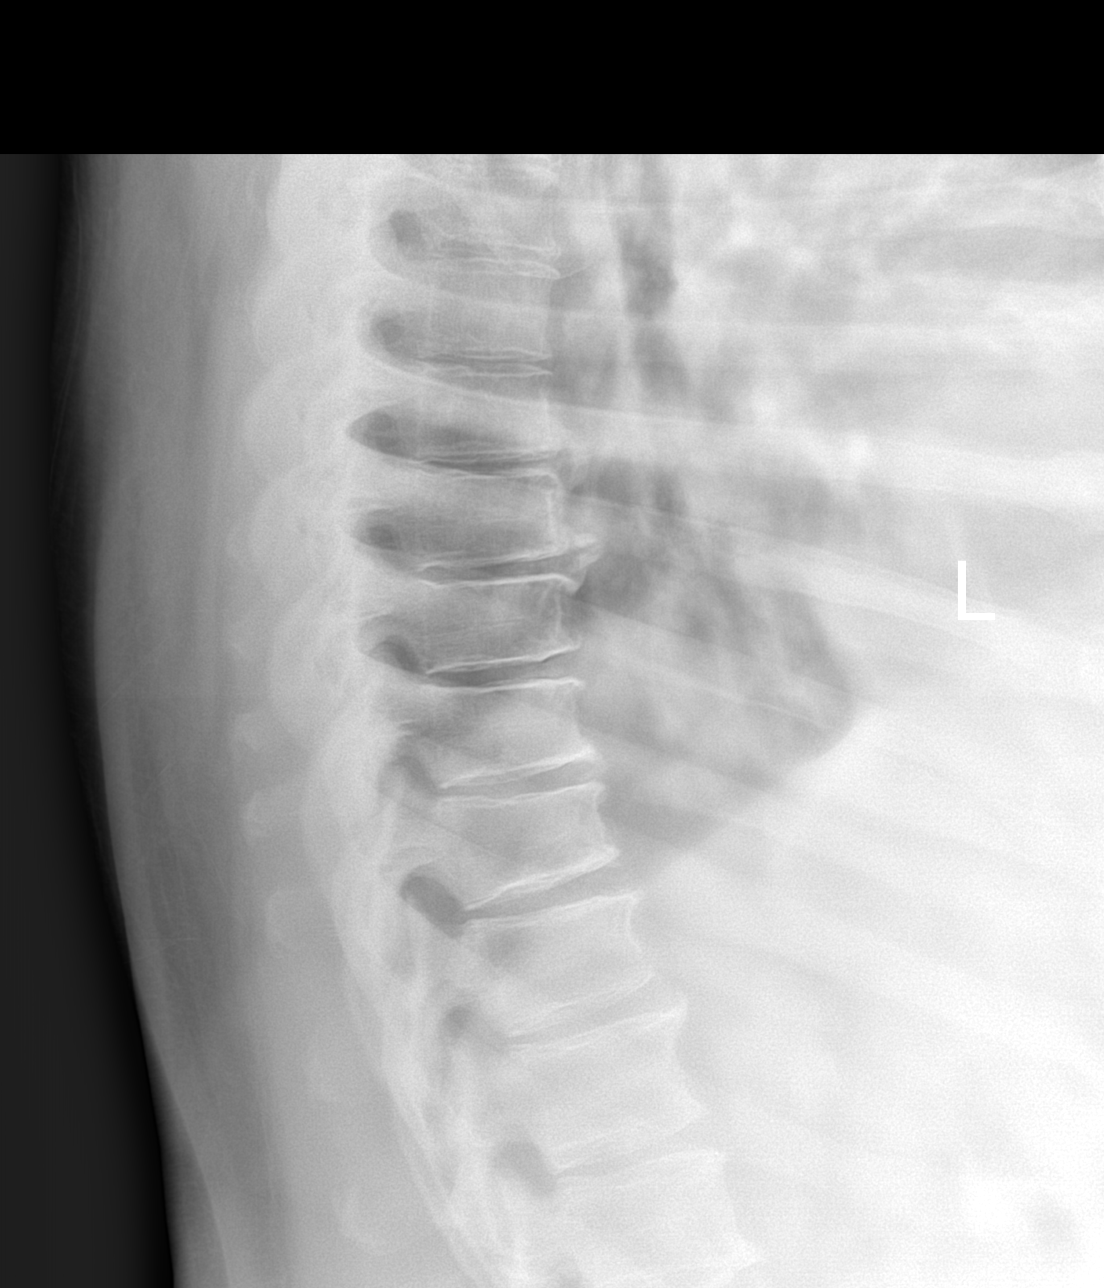
[im 3/4]
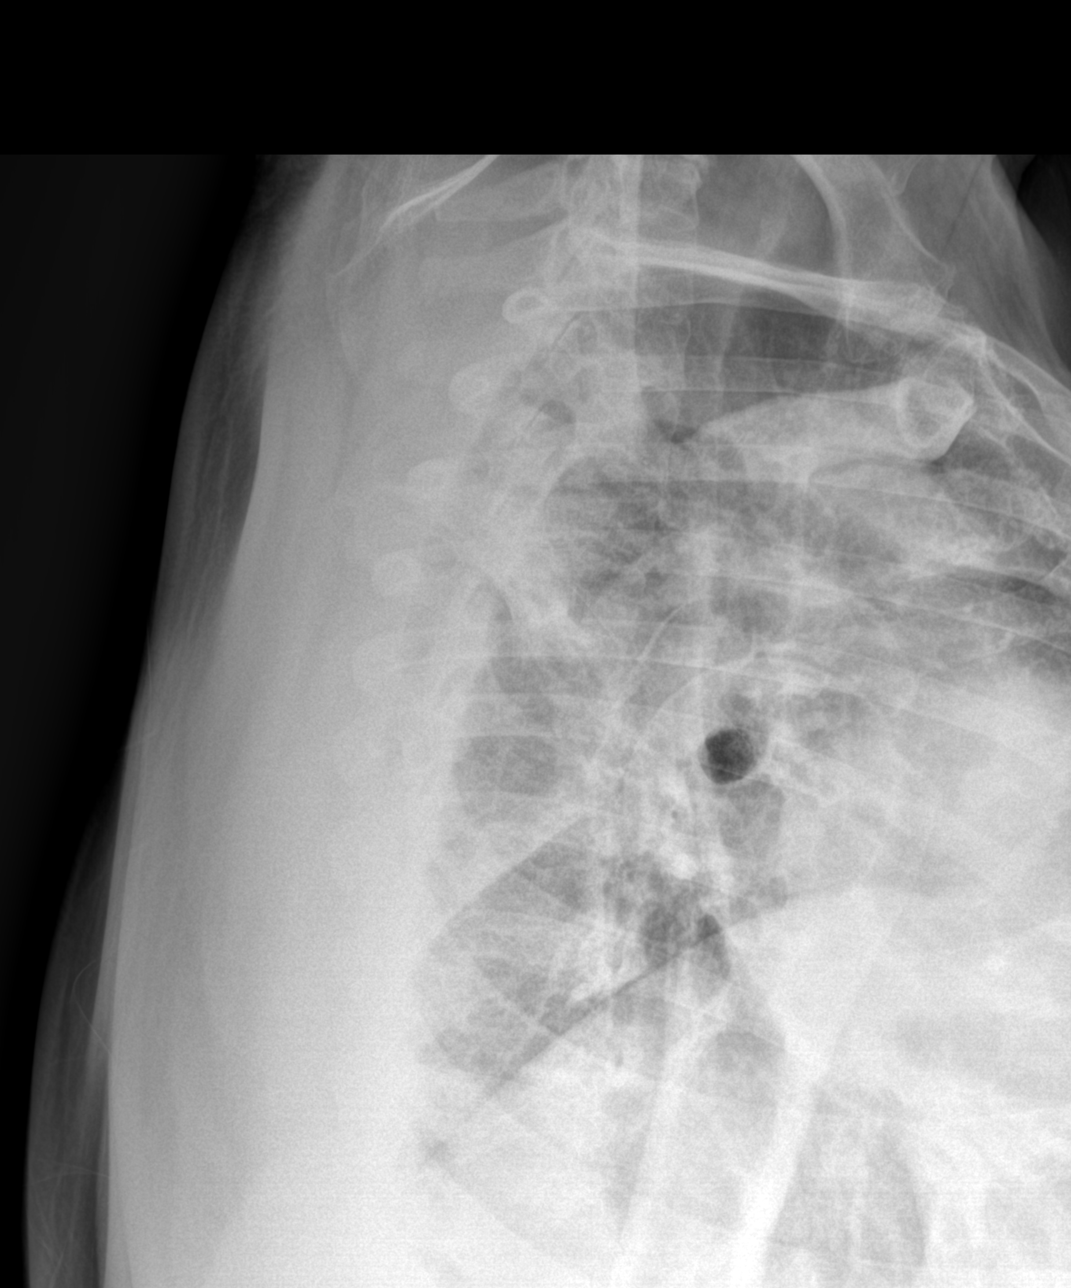
[im 4/4]
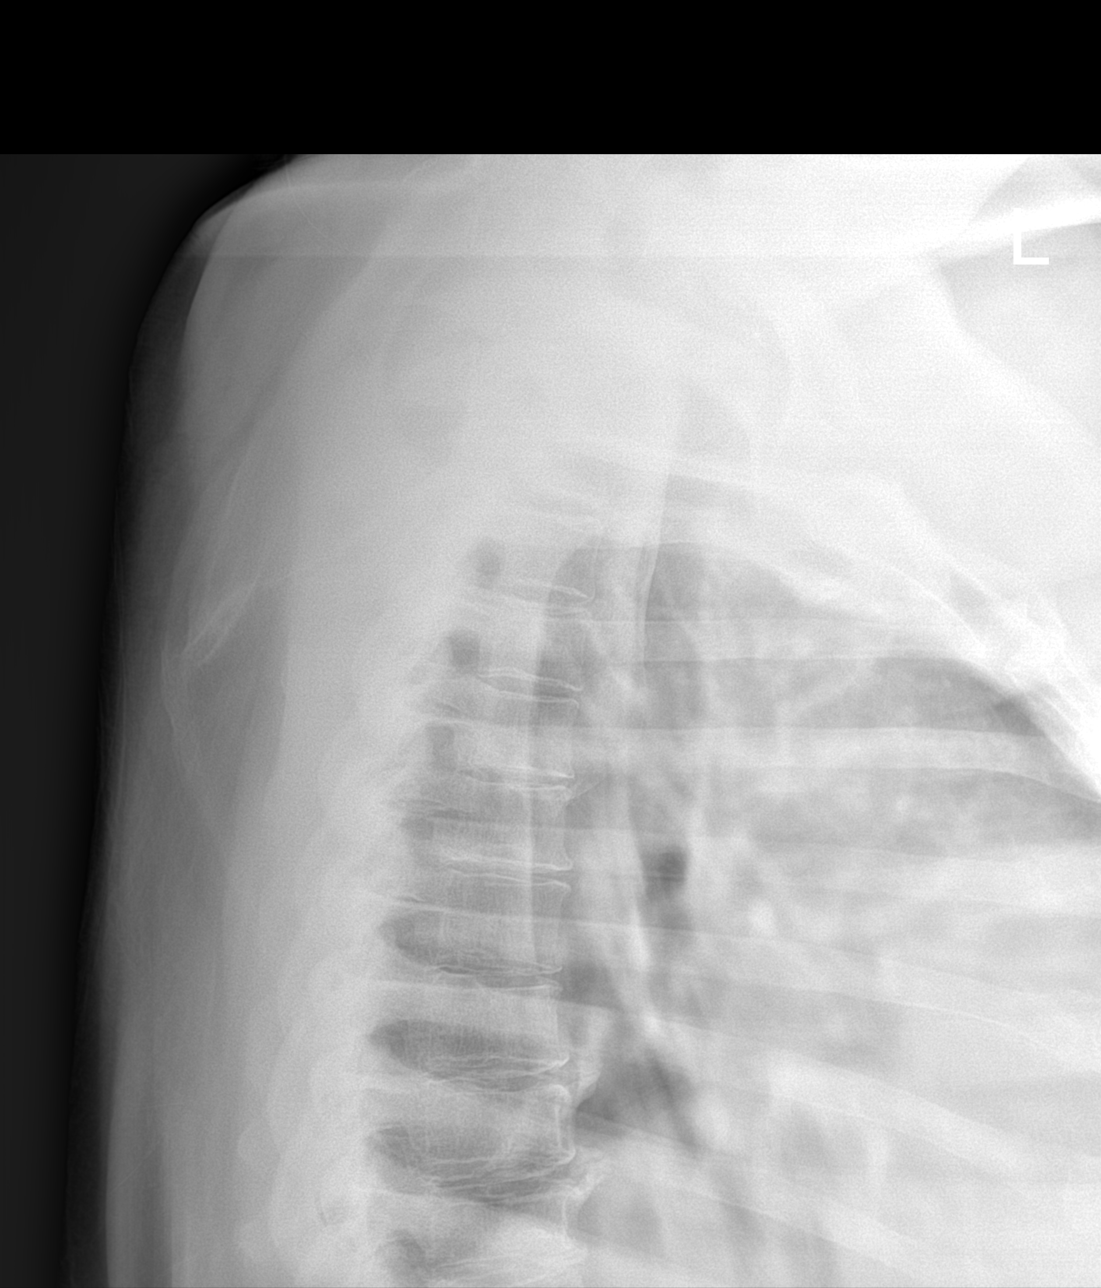

[4 of 4 positions shown; findings below may reference images not displayed]

FINDINGS: Frontal and lateral views demonstrate mild to moderate degenerative changes at multiple levels with disc space narrowing and osteophyte formation.  There is no fracture, malalignment, or lytic or blastic bony lesion.
IMPRESSION: Mild to moderate degenerative changes.
